# Patient Record
Sex: Female | Born: 1975 | Race: Asian | Hispanic: No | Marital: Married | Smoking: Never smoker
Health system: Southern US, Community
[De-identification: ages and names within clinical notes are randomized; demographics above are authoritative.]

## PROBLEM LIST (undated history)

## (undated) DIAGNOSIS — K921 Melena: Secondary | ICD-10-CM

## (undated) DIAGNOSIS — E079 Disorder of thyroid, unspecified: Secondary | ICD-10-CM

## (undated) HISTORY — DX: Disorder of thyroid, unspecified: E07.9

## (undated) HISTORY — PX: APPENDECTOMY: SHX54

## (undated) HISTORY — DX: Melena: K92.1

---

## 2013-01-09 ENCOUNTER — Other Ambulatory Visit (HOSPITAL_COMMUNITY)
Admission: RE | Admit: 2013-01-09 | Discharge: 2013-01-09 | Disposition: A | Discharge status: Home / Self Care | Payer: No Typology Code available for payment source | Source: Ambulatory Visit | Attending: Obstetrics and Gynecology | Admitting: Obstetrics and Gynecology

## 2013-01-09 DIAGNOSIS — Z1151 Encounter for screening for human papillomavirus (HPV): Secondary | ICD-10-CM | POA: Insufficient documentation

## 2013-01-09 DIAGNOSIS — Z01419 Encounter for gynecological examination (general) (routine) without abnormal findings: Secondary | ICD-10-CM | POA: Insufficient documentation

## 2013-02-22 ENCOUNTER — Other Ambulatory Visit: Payer: Self-pay

## 2013-02-22 ENCOUNTER — Other Ambulatory Visit: Payer: Self-pay | Admitting: Obstetrics and Gynecology

## 2013-02-22 DIAGNOSIS — Z1231 Encounter for screening mammogram for malignant neoplasm of breast: Secondary | ICD-10-CM

## 2013-02-22 DIAGNOSIS — N6320 Unspecified lump in the left breast, unspecified quadrant: Secondary | ICD-10-CM

## 2013-03-08 ENCOUNTER — Other Ambulatory Visit: Payer: No Typology Code available for payment source

## 2013-03-10 ENCOUNTER — Ambulatory Visit
Admission: RE | Admit: 2013-03-10 | Discharge: 2013-03-10 | Disposition: A | Discharge status: Home / Self Care | Payer: No Typology Code available for payment source | Source: Ambulatory Visit | Attending: Obstetrics and Gynecology | Admitting: Obstetrics and Gynecology

## 2013-03-10 DIAGNOSIS — N6320 Unspecified lump in the left breast, unspecified quadrant: Secondary | ICD-10-CM

## 2013-03-16 ENCOUNTER — Ambulatory Visit: Payer: No Typology Code available for payment source

## 2013-10-02 ENCOUNTER — Other Ambulatory Visit: Payer: Self-pay | Admitting: Obstetrics and Gynecology

## 2013-10-02 DIAGNOSIS — N6320 Unspecified lump in the left breast, unspecified quadrant: Secondary | ICD-10-CM

## 2013-10-09 ENCOUNTER — Ambulatory Visit
Admission: RE | Admit: 2013-10-09 | Discharge: 2013-10-09 | Disposition: A | Discharge status: Home / Self Care | Payer: No Typology Code available for payment source | Source: Ambulatory Visit | Attending: Obstetrics and Gynecology | Admitting: Obstetrics and Gynecology

## 2013-10-09 DIAGNOSIS — N6320 Unspecified lump in the left breast, unspecified quadrant: Secondary | ICD-10-CM

## 2014-05-02 ENCOUNTER — Other Ambulatory Visit: Payer: Self-pay | Admitting: Obstetrics and Gynecology

## 2014-05-02 DIAGNOSIS — D242 Benign neoplasm of left breast: Secondary | ICD-10-CM

## 2014-05-04 ENCOUNTER — Encounter (INDEPENDENT_AMBULATORY_CARE_PROVIDER_SITE_OTHER): Payer: Self-pay

## 2014-05-04 ENCOUNTER — Ambulatory Visit
Admission: RE | Admit: 2014-05-04 | Discharge: 2014-05-04 | Disposition: A | Discharge status: Home / Self Care | Payer: No Typology Code available for payment source | Source: Ambulatory Visit | Attending: Obstetrics and Gynecology | Admitting: Obstetrics and Gynecology

## 2014-05-04 DIAGNOSIS — D242 Benign neoplasm of left breast: Secondary | ICD-10-CM

## 2015-02-27 LAB — TSH: TSH: 1.56 (ref 0.41–5.90)

## 2015-03-27 ENCOUNTER — Other Ambulatory Visit: Payer: Self-pay | Admitting: Obstetrics and Gynecology

## 2015-03-27 DIAGNOSIS — N644 Mastodynia: Secondary | ICD-10-CM

## 2015-03-27 DIAGNOSIS — N6320 Unspecified lump in the left breast, unspecified quadrant: Secondary | ICD-10-CM

## 2015-04-03 ENCOUNTER — Ambulatory Visit
Admission: RE | Admit: 2015-04-03 | Discharge: 2015-04-03 | Disposition: A | Discharge status: Home / Self Care | Payer: No Typology Code available for payment source | Source: Ambulatory Visit | Attending: Obstetrics and Gynecology | Admitting: Obstetrics and Gynecology

## 2015-04-03 DIAGNOSIS — N6320 Unspecified lump in the left breast, unspecified quadrant: Secondary | ICD-10-CM

## 2015-04-03 DIAGNOSIS — N644 Mastodynia: Secondary | ICD-10-CM

## 2015-09-17 ENCOUNTER — Other Ambulatory Visit: Payer: Self-pay | Admitting: Obstetrics and Gynecology

## 2015-09-17 ENCOUNTER — Other Ambulatory Visit (HOSPITAL_COMMUNITY)
Admission: RE | Admit: 2015-09-17 | Discharge: 2015-09-17 | Disposition: A | Discharge status: Home / Self Care | Payer: No Typology Code available for payment source | Source: Ambulatory Visit | Attending: Obstetrics and Gynecology | Admitting: Obstetrics and Gynecology

## 2015-09-17 DIAGNOSIS — Z1151 Encounter for screening for human papillomavirus (HPV): Secondary | ICD-10-CM | POA: Diagnosis present

## 2015-09-17 DIAGNOSIS — Z01419 Encounter for gynecological examination (general) (routine) without abnormal findings: Secondary | ICD-10-CM | POA: Diagnosis present

## 2015-09-18 LAB — CYTOLOGY - PAP

## 2015-09-19 ENCOUNTER — Other Ambulatory Visit: Payer: Self-pay | Admitting: Obstetrics and Gynecology

## 2015-09-19 DIAGNOSIS — N6310 Unspecified lump in the right breast, unspecified quadrant: Secondary | ICD-10-CM

## 2015-09-19 DIAGNOSIS — N6320 Unspecified lump in the left breast, unspecified quadrant: Secondary | ICD-10-CM

## 2015-10-07 ENCOUNTER — Ambulatory Visit
Admission: RE | Admit: 2015-10-07 | Discharge: 2015-10-07 | Disposition: A | Discharge status: Home / Self Care | Payer: No Typology Code available for payment source | Source: Ambulatory Visit | Attending: Obstetrics and Gynecology | Admitting: Obstetrics and Gynecology

## 2015-10-07 DIAGNOSIS — N6310 Unspecified lump in the right breast, unspecified quadrant: Secondary | ICD-10-CM

## 2015-10-07 DIAGNOSIS — N6320 Unspecified lump in the left breast, unspecified quadrant: Secondary | ICD-10-CM

## 2016-05-05 ENCOUNTER — Other Ambulatory Visit: Payer: Self-pay | Admitting: Obstetrics and Gynecology

## 2016-05-05 DIAGNOSIS — N63 Unspecified lump in unspecified breast: Secondary | ICD-10-CM

## 2016-05-15 ENCOUNTER — Other Ambulatory Visit: Payer: No Typology Code available for payment source

## 2016-05-22 ENCOUNTER — Ambulatory Visit
Admission: RE | Admit: 2016-05-22 | Discharge: 2016-05-22 | Disposition: A | Discharge status: Home / Self Care | Payer: No Typology Code available for payment source | Source: Ambulatory Visit | Attending: Obstetrics and Gynecology | Admitting: Obstetrics and Gynecology

## 2016-05-22 DIAGNOSIS — N63 Unspecified lump in unspecified breast: Secondary | ICD-10-CM

## 2016-07-29 ENCOUNTER — Other Ambulatory Visit: Payer: Self-pay | Admitting: Internal Medicine

## 2016-07-29 ENCOUNTER — Ambulatory Visit
Admission: RE | Admit: 2016-07-29 | Discharge: 2016-07-29 | Disposition: A | Discharge status: Home / Self Care | Payer: No Typology Code available for payment source | Source: Ambulatory Visit | Attending: Internal Medicine | Admitting: Internal Medicine

## 2016-07-29 DIAGNOSIS — M79672 Pain in left foot: Secondary | ICD-10-CM

## 2016-10-22 LAB — BASIC METABOLIC PANEL
BUN: 9 (ref 4–21)
Creatinine: 0.6 (ref 0.5–1.1)
Glucose: 90
Potassium: 3.8 (ref 3.4–5.3)
SODIUM: 138 (ref 137–147)

## 2016-10-22 LAB — LIPID PANEL
CHOLESTEROL: 164 (ref 0–200)
LDL Cholesterol: 117
TRIGLYCERIDES: 46 (ref 40–160)

## 2016-10-22 LAB — VITAMIN D 25 HYDROXY (VIT D DEFICIENCY, FRACTURES): Vit D, 25-Hydroxy: 32.3

## 2016-10-22 LAB — CBC AND DIFFERENTIAL
HEMATOCRIT: 37 (ref 36–46)
HEMOGLOBIN: 13 (ref 12.0–16.0)
PLATELETS: 250 (ref 150–399)
WBC: 7.7

## 2016-10-22 LAB — HEPATIC FUNCTION PANEL
ALK PHOS: 44 (ref 25–125)
ALT: 25 (ref 7–35)
AST: 17 (ref 13–35)
Bilirubin, Total: 0.6

## 2016-10-22 LAB — TSH: TSH: 2.02 (ref 0.41–5.90)

## 2017-03-10 ENCOUNTER — Ambulatory Visit
Admission: RE | Admit: 2017-03-10 | Discharge: 2017-03-10 | Disposition: A | Discharge status: Home / Self Care | Payer: No Typology Code available for payment source | Source: Ambulatory Visit | Attending: Internal Medicine | Admitting: Internal Medicine

## 2017-03-10 ENCOUNTER — Other Ambulatory Visit: Payer: Self-pay | Admitting: Internal Medicine

## 2017-03-10 DIAGNOSIS — M25561 Pain in right knee: Secondary | ICD-10-CM

## 2017-04-13 ENCOUNTER — Other Ambulatory Visit: Payer: Self-pay | Admitting: Orthopedic Surgery

## 2017-04-13 DIAGNOSIS — M25561 Pain in right knee: Secondary | ICD-10-CM

## 2017-04-24 ENCOUNTER — Ambulatory Visit
Admission: RE | Admit: 2017-04-24 | Discharge: 2017-04-24 | Disposition: A | Discharge status: Home / Self Care | Payer: No Typology Code available for payment source | Source: Ambulatory Visit | Attending: Orthopedic Surgery | Admitting: Orthopedic Surgery

## 2017-04-24 DIAGNOSIS — M25561 Pain in right knee: Secondary | ICD-10-CM

## 2017-08-26 LAB — TSH: TSH: 0.98 (ref 0.41–5.90)

## 2017-10-07 ENCOUNTER — Other Ambulatory Visit: Payer: Self-pay | Admitting: Obstetrics and Gynecology

## 2017-10-07 DIAGNOSIS — N63 Unspecified lump in unspecified breast: Secondary | ICD-10-CM

## 2017-10-11 ENCOUNTER — Other Ambulatory Visit: Payer: No Typology Code available for payment source

## 2017-10-13 ENCOUNTER — Ambulatory Visit
Admission: RE | Admit: 2017-10-13 | Discharge: 2017-10-13 | Disposition: A | Discharge status: Home / Self Care | Payer: 59 | Source: Ambulatory Visit | Attending: Obstetrics and Gynecology | Admitting: Obstetrics and Gynecology

## 2017-10-13 ENCOUNTER — Ambulatory Visit
Admission: RE | Admit: 2017-10-13 | Discharge: 2017-10-13 | Disposition: A | Discharge status: Home / Self Care | Payer: Self-pay | Source: Ambulatory Visit | Attending: Obstetrics and Gynecology | Admitting: Obstetrics and Gynecology

## 2017-10-13 DIAGNOSIS — N63 Unspecified lump in unspecified breast: Secondary | ICD-10-CM

## 2017-10-13 LAB — HM MAMMOGRAPHY

## 2017-12-02 ENCOUNTER — Ambulatory Visit (INDEPENDENT_AMBULATORY_CARE_PROVIDER_SITE_OTHER): Payer: 59 | Admitting: Family Medicine

## 2017-12-02 ENCOUNTER — Encounter: Payer: Self-pay | Admitting: Family Medicine

## 2017-12-02 VITALS — BP 116/62 | HR 80 | Temp 98.0°F | Ht 64.0 in | Wt 142.2 lb

## 2017-12-02 DIAGNOSIS — Z13220 Encounter for screening for lipoid disorders: Secondary | ICD-10-CM

## 2017-12-02 DIAGNOSIS — M25561 Pain in right knee: Secondary | ICD-10-CM

## 2017-12-02 DIAGNOSIS — Z0001 Encounter for general adult medical examination with abnormal findings: Secondary | ICD-10-CM | POA: Diagnosis not present

## 2017-12-02 DIAGNOSIS — G8929 Other chronic pain: Secondary | ICD-10-CM

## 2017-12-02 DIAGNOSIS — E039 Hypothyroidism, unspecified: Secondary | ICD-10-CM | POA: Insufficient documentation

## 2017-12-02 DIAGNOSIS — K649 Unspecified hemorrhoids: Secondary | ICD-10-CM | POA: Insufficient documentation

## 2017-12-02 MED ORDER — HYDROCORTISONE ACETATE 25 MG RE SUPP: 25.0000 mg | Freq: Two times a day (BID) | RECTAL | 0 refills | Status: AC

## 2017-12-02 NOTE — Assessment & Plan Note (Signed)
Stable.  Continue Synthroid 75 mcg daily.  Check TSH today.

## 2017-12-02 NOTE — Patient Instructions (Signed)
It was very nice to see you today!  Please use the suppository for your hemorrhoid. Let me know if your symptoms worsen or do not improve over the next few days.  Come back to see me in 1 year for your next physical.  I would like to get your records form your OBGYN regarding your most recent lab work.  Come back to see me sooner as needed.  Take care, Dr Jerline Pain   Preventive Care 40-64 Years, Female Preventive care refers to lifestyle choices and visits with your health care provider that can promote health and wellness. What does preventive care include?  A yearly physical exam. This is also called an annual well check.  Dental exams once or twice a year.  Routine eye exams. Ask your health care provider how often you should have your eyes checked.  Personal lifestyle choices, including: ? Daily care of your teeth and gums. ? Regular physical activity. ? Eating a healthy diet. ? Avoiding tobacco and drug use. ? Limiting alcohol use. ? Practicing safe sex. ? Taking low-dose aspirin daily starting at age 46. ? Taking vitamin and mineral supplements as recommended by your health care provider. What happens during an annual well check? The services and screenings done by your health care provider during your annual well check will depend on your age, overall health, lifestyle risk factors, and family history of disease. Counseling Your health care provider may ask you questions about your:  Alcohol use.  Tobacco use.  Drug use.  Emotional well-being.  Home and relationship well-being.  Sexual activity.  Eating habits.  Work and work Statistician.  Method of birth control.  Menstrual cycle.  Pregnancy history.  Screening You may have the following tests or measurements:  Height, weight, and BMI.  Blood pressure.  Lipid and cholesterol levels. These may be checked every 5 years, or more frequently if you are over 22 years old.  Skin check.  Lung cancer  screening. You may have this screening every year starting at age 107 if you have a 30-pack-year history of smoking and currently smoke or have quit within the past 15 years.  Fecal occult blood test (FOBT) of the stool. You may have this test every year starting at age 31.  Flexible sigmoidoscopy or colonoscopy. You may have a sigmoidoscopy every 5 years or a colonoscopy every 10 years starting at age 20.  Hepatitis C blood test.  Hepatitis B blood test.  Sexually transmitted disease (STD) testing.  Diabetes screening. This is done by checking your blood sugar (glucose) after you have not eaten for a while (fasting). You may have this done every 1-3 years.  Mammogram. This may be done every 1-2 years. Talk to your health care provider about when you should start having regular mammograms. This may depend on whether you have a family history of breast cancer.  BRCA-related cancer screening. This may be done if you have a family history of breast, ovarian, tubal, or peritoneal cancers.  Pelvic exam and Pap test. This may be done every 3 years starting at age 54. Starting at age 77, this may be done every 5 years if you have a Pap test in combination with an HPV test.  Bone density scan. This is done to screen for osteoporosis. You may have this scan if you are at high risk for osteoporosis.  Discuss your test results, treatment options, and if necessary, the need for more tests with your health care provider. Vaccines Your health  care provider may recommend certain vaccines, such as:  Influenza vaccine. This is recommended every year.  Tetanus, diphtheria, and acellular pertussis (Tdap, Td) vaccine. You may need a Td booster every 10 years.  Varicella vaccine. You may need this if you have not been vaccinated.  Zoster vaccine. You may need this after age 44.  Measles, mumps, and rubella (MMR) vaccine. You may need at least one dose of MMR if you were born in 1957 or later. You may  also need a second dose.  Pneumococcal 13-valent conjugate (PCV13) vaccine. You may need this if you have certain conditions and were not previously vaccinated.  Pneumococcal polysaccharide (PPSV23) vaccine. You may need one or two doses if you smoke cigarettes or if you have certain conditions.  Meningococcal vaccine. You may need this if you have certain conditions.  Hepatitis A vaccine. You may need this if you have certain conditions or if you travel or work in places where you may be exposed to hepatitis A.  Hepatitis B vaccine. You may need this if you have certain conditions or if you travel or work in places where you may be exposed to hepatitis B.  Haemophilus influenzae type b (Hib) vaccine. You may need this if you have certain conditions.  Talk to your health care provider about which screenings and vaccines you need and how often you need them. This information is not intended to replace advice given to you by your health care provider. Make sure you discuss any questions you have with your health care provider. Document Released: 06/28/2015 Document Revised: 02/19/2016 Document Reviewed: 04/02/2015 Elsevier Interactive Patient Education  [employer].

## 2017-12-02 NOTE — Progress Notes (Signed)
Subjective:  Tammy Bates is a 42 y.o. female who presents today for her annual comprehensive physical exam and to establish care.  HPI:  She has 1 acute complaint today.   1.  Rectal bleeding.  Patient with intermittent rectal bleeding for the past year.  Had a recent worsening of her symptoms within the past few days.  She has been prescribed a suppository in the past, but does not remember the name of this.  Blood is bright red in characteristic.  Symptoms are worse after eating spicy foods.  Symptoms improved with fiber intake.  Lifestyle Diet: No specific diets. Exercise: No specific exercises.   Depression screen PHQ 2/9 12/02/2017  Decreased Interest 0  Down, Depressed, Hopeless 0  PHQ - 2 Score 0   Health Maintenance Due  Topic Date Due  . HIV Screening  06/17/1992  . TETANUS/TDAP  06/17/1996    ROS: Rectal bleeding, otherwise a complete review of systems was negative.   PMH:  The following were reviewed and entered/updated in epic: Past Medical History:  Diagnosis Date  . Thyroid disease    Patient Active Problem List   Diagnosis Date Noted  . Hemorrhoids 12/02/2017  . Hypothyroidism 12/02/2017  . Chronic pain of right knee 12/02/2017   Past Surgical History:  Procedure Laterality Date  . APPENDECTOMY      Family History  Problem Relation Age of Onset  . Hypertension Mother   . Diabetes Mother   . Cancer Neg Hx     Medications- reviewed and updated Current Outpatient Medications  Medication Sig Dispense Refill  . levothyroxine (SYNTHROID, LEVOTHROID) 75 MCG tablet Take 75 mcg by mouth daily.  2  . hydrocortisone (ANUSOL-HC) 25 MG suppository Place 1 suppository (25 mg total) rectally 2 (two) times daily. 12 suppository 0   No current facility-administered medications for this visit.     Allergies-reviewed and updated No Known Allergies  Social History   Socioeconomic History  . Marital status: Married    Spouse name: Not on file    . Number of children: Not on file  . Years of education: Not on file  . Highest education level: Not on file  Occupational History  . Not on file  Social Needs  . Financial resource strain: Not on file  . Food insecurity:    Worry: Not on file    Inability: Not on file  . Transportation needs:    Medical: Not on file    Non-medical: Not on file  Tobacco Use  . Smoking status: Never Smoker  . Smokeless tobacco: Never Used  Substance and Sexual Activity  . Alcohol use: Not Currently  . Drug use: Never  . Sexual activity: Yes  Lifestyle  . Physical activity:    Days per week: Not on file    Minutes per session: Not on file  . Stress: Not on file  Relationships  . Social connections:    Talks on phone: Not on file    Gets together: Not on file    Attends religious service: Not on file    Active member of club or organization: Not on file    Attends meetings of clubs or organizations: Not on file    Relationship status: Not on file  Other Topics Concern  . Not on file  Social History Narrative  . Not on file    Objective:  Physical Exam: BP 116/62 (BP Location: Left Arm, Patient Position: Sitting, Cuff Size: Normal)   Pulse 80  Temp 98 F (36.7 C) (Oral)   Ht 5\' 4"  (1.626 m)   Wt 142 lb 3.2 oz (64.5 kg)   SpO2 97%   BMI 24.41 kg/m   Body mass index is 24.41 kg/m. Wt Readings from Last 3 Encounters:  12/02/17 142 lb 3.2 oz (64.5 kg)   Gen: NAD, resting comfortably HEENT: TMs normal bilaterally. OP clear. No thyromegaly noted.  CV: RRR with no murmurs appreciated Pulm: NWOB, CTAB with no crackles, wheezes, or rhonchi GI: Normal bowel sounds present. Soft, Nontender, Nondistended. GU: Small right posterior hemorrhoid noted.  Scant amount of red blood noted. MSK: no edema, cyanosis, or clubbing noted Skin: warm, dry Neuro: CN2-12 grossly intact. Strength 5/5 in upper and lower extremities. Reflexes symmetric and intact bilaterally.  Psych: Normal affect and  thought content  Assessment/Plan:  Hemorrhoids No red flag signs or symptoms.  Check CBC today.  Start hydrocortisone suppository.  Discussed reasons to return to care.  If no improvement, would consider referral to GI.  Hypothyroidism Stable.  Continue Synthroid 75 mcg daily.  Check TSH today.  Preventative Healthcare: Check lipid panel.  Up-to-date on Pap smear and mammogram.  Will obtain records from her OB/GYN for these.  Patient Counseling(The following topics were reviewed and/or handout was given):  -Nutrition: Stressed importance of moderation in sodium/caffeine intake, saturated fat and cholesterol, caloric balance, sufficient intake of fresh fruits, vegetables, and fiber.  -Stressed the importance of regular exercise.   -Substance Abuse: Discussed cessation/primary prevention of tobacco, alcohol, or other drug use; driving or other dangerous activities under the influence; availability of treatment for abuse.   -Injury prevention: Discussed safety belts, safety helmets, smoke detector, smoking near bedding or upholstery.   -Sexuality: Discussed sexually transmitted diseases, partner selection, use of condoms, avoidance of unintended pregnancy and contraceptive alternatives.   -Dental health: Discussed importance of regular tooth brushing, flossing, and dental visits.  -Health maintenance and immunizations reviewed. Please refer to Health maintenance section.  Return to care in 1 year for next preventative visit.   Algis Greenhouse. Jerline Pain, MD 12/02/2017 4:05 PM

## 2017-12-02 NOTE — Assessment & Plan Note (Signed)
No red flag signs or symptoms.  Check CBC today.  Start hydrocortisone suppository.  Discussed reasons to return to care.  If no improvement, would consider referral to GI.

## 2017-12-03 LAB — TSH: TSH: 1.21 u[IU]/mL (ref 0.35–4.50)

## 2017-12-03 LAB — COMPREHENSIVE METABOLIC PANEL
ALT: 40 U/L — ABNORMAL HIGH (ref 0–35)
AST: 29 U/L (ref 0–37)
Albumin: 3.9 g/dL (ref 3.5–5.2)
Alkaline Phosphatase: 49 U/L (ref 39–117)
BILIRUBIN TOTAL: 0.5 mg/dL (ref 0.2–1.2)
BUN: 13 mg/dL (ref 6–23)
CHLORIDE: 106 meq/L (ref 96–112)
CO2: 27 meq/L (ref 19–32)
Calcium: 8.9 mg/dL (ref 8.4–10.5)
Creatinine, Ser: 0.81 mg/dL (ref 0.40–1.20)
GFR: 83.04 mL/min (ref >60.00)
Glucose, Bld: 98 mg/dL (ref 70–99)
Potassium: 3.8 mEq/L (ref 3.5–5.1)
Sodium: 139 mEq/L (ref 135–145)
Total Protein: 6.9 g/dL (ref 6.0–8.3)

## 2017-12-03 LAB — LIPID PANEL
CHOLESTEROL: 143 mg/dL (ref 0–200)
HDL: 35.6 mg/dL — ABNORMAL LOW (ref >39.00)
LDL CALC: 88 mg/dL (ref 0–99)
NonHDL: 106.95
TRIGLYCERIDES: 97 mg/dL (ref 0.0–149.0)
Total CHOL/HDL Ratio: 4
VLDL: 19.4 mg/dL (ref 0.0–40.0)

## 2017-12-03 LAB — CBC
HCT: 37.3 % (ref 36.0–46.0)
Hemoglobin: 12.9 g/dL (ref 12.0–15.0)
MCHC: 34.4 g/dL (ref 30.0–36.0)
MCV: 89.2 fl (ref 78.0–100.0)
PLATELETS: 278 10*3/uL (ref 150.0–400.0)
RBC: 4.18 Mil/uL (ref 3.87–5.11)
RDW: 12.1 % (ref 11.5–15.5)
WBC: 9.4 10*3/uL (ref 4.0–10.5)

## 2018-07-04 ENCOUNTER — Ambulatory Visit (INDEPENDENT_AMBULATORY_CARE_PROVIDER_SITE_OTHER): Payer: 59 | Admitting: Physician Assistant

## 2018-07-04 ENCOUNTER — Encounter: Payer: Self-pay | Admitting: Physician Assistant

## 2018-07-04 VITALS — BP 108/70 | HR 84 | Temp 98.6°F | Ht 64.0 in | Wt 144.5 lb

## 2018-07-04 DIAGNOSIS — J0100 Acute maxillary sinusitis, unspecified: Secondary | ICD-10-CM | POA: Diagnosis not present

## 2018-07-04 LAB — POC INFLUENZA A&B (BINAX/QUICKVUE)
INFLUENZA A, POC: NEGATIVE
INFLUENZA B, POC: NEGATIVE

## 2018-07-04 MED ORDER — AMOXICILLIN-POT CLAVULANATE 875-125 MG PO TABS: 1.0000 | ORAL_TABLET | Freq: Two times a day (BID) | ORAL | 0 refills | Status: DC

## 2018-07-04 NOTE — Progress Notes (Signed)
Tammy Bates is a 43 y.o. female here for a new problem.  I acted as a Education administrator for [employer], PA-C Anselmo Pickler, LPN  History of Present Illness:   Chief Complaint  Patient presents with  . Cough    Cough  This is a new problem. Episode onset: Started 2 weeks ago. The problem has been gradually worsening. The problem occurs every few hours (evening worse). The cough is productive of sputum (expectorating yellow/green sputum.). Associated symptoms include headaches, nasal congestion, postnasal drip and a sore throat. Pertinent negatives include no chills, ear congestion, ear pain, fever or shortness of breath. Associated symptoms comments: Body aches. The symptoms are aggravated by lying down. Treatments tried: Nyquil. The treatment provided no relief. There is no history of asthma, bronchitis or pneumonia.    Past Medical History:  Diagnosis Date  . Blood in stool   . Thyroid disease      Social History   Socioeconomic History  . Marital status: Married    Spouse name: Not on file  . Number of children: Not on file  . Years of education: Not on file  . Highest education level: Not on file  Occupational History  . Not on file  Social Needs  . Financial resource strain: Not on file  . Food insecurity:    Worry: Not on file    Inability: Not on file  . Transportation needs:    Medical: Not on file    Non-medical: Not on file  Tobacco Use  . Smoking status: Never Smoker  . Smokeless tobacco: Never Used  Substance and Sexual Activity  . Alcohol use: Not Currently  . Drug use: Never  . Sexual activity: Yes  Lifestyle  . Physical activity:    Days per week: Not on file    Minutes per session: Not on file  . Stress: Not on file  Relationships  . Social connections:    Talks on phone: Not on file    Gets together: Not on file    Attends religious service: Not on file    Active member of club or organization: Not on file    Attends meetings of clubs or  organizations: Not on file    Relationship status: Not on file  . Intimate partner violence:    Fear of current or ex partner: Not on file    Emotionally abused: Not on file    Physically abused: Not on file    Forced sexual activity: Not on file  Other Topics Concern  . Not on file  Social History Narrative  . Not on file    Past Surgical History:  Procedure Laterality Date  . APPENDECTOMY      Family History  Problem Relation Age of Onset  . Hypertension Mother   . Diabetes Mother   . Diabetes Father   . Hypertension Father   . Cancer Neg Hx     No Known Allergies  Current Medications:   Current Outpatient Medications:  .  hydrocortisone (ANUSOL-HC) 25 MG suppository, Place 1 suppository (25 mg total) rectally 2 (two) times daily., Disp: 12 suppository, Rfl: 0 .  levothyroxine (SYNTHROID, LEVOTHROID) 75 MCG tablet, Take 75 mcg by mouth daily., Disp: , Rfl: 2 .  amoxicillin-clavulanate (AUGMENTIN) 875-125 MG tablet, Take 1 tablet by mouth 2 (two) times daily., Disp: 20 tablet, Rfl: 0   Review of Systems:   Review of Systems  Constitutional: Negative for chills and fever.  HENT: Positive for postnasal drip  and sore throat. Negative for ear pain.   Respiratory: Positive for cough. Negative for shortness of breath.   Neurological: Positive for headaches.    Vitals:   Vitals:   07/04/18 1244  BP: 108/70  Pulse: 84  Temp: 98.6 F (37 C)  TempSrc: Oral  SpO2: 99%  Weight: 144 lb 8 oz (65.5 kg)  Height: 5\' 4"  (1.626 m)     Body mass index is 24.8 kg/m.  Physical Exam:   Physical Exam Vitals signs and nursing note reviewed.  Constitutional:      General: She is not in acute distress.    Appearance: She is well-developed. She is not ill-appearing or toxic-appearing.  HENT:     Head: Normocephalic and atraumatic.     Right Ear: Tympanic membrane, ear canal and external ear normal. Tympanic membrane is not erythematous, retracted or bulging.     Left Ear:  Tympanic membrane, ear canal and external ear normal. Tympanic membrane is not erythematous, retracted or bulging.     Nose: Congestion and rhinorrhea present.     Right Sinus: No maxillary sinus tenderness or frontal sinus tenderness.     Left Sinus: No maxillary sinus tenderness or frontal sinus tenderness.     Mouth/Throat:     Lips: Pink.     Mouth: Mucous membranes are moist.     Pharynx: Uvula midline. Posterior oropharyngeal erythema present.     Tonsils: Swelling: 0 on the right. 0 on the left.  Eyes:     General: Lids are normal.     Conjunctiva/sclera: Conjunctivae normal.  Neck:     Trachea: Trachea normal.  Cardiovascular:     Rate and Rhythm: Normal rate and regular rhythm.     Heart sounds: Normal heart sounds, S1 normal and S2 normal.  Pulmonary:     Effort: Pulmonary effort is normal.     Breath sounds: Normal breath sounds. No decreased breath sounds, wheezing, rhonchi or rales.  Lymphadenopathy:     Cervical: No cervical adenopathy.  Skin:    General: Skin is warm and dry.  Neurological:     Mental Status: She is alert.  Psychiatric:        Speech: Speech normal.        Behavior: Behavior normal. Behavior is cooperative.     Results for orders placed or performed in visit on 07/04/18  POC Influenza A&B(BINAX/QUICKVUE)  Result Value Ref Range   Influenza A, POC Negative Negative   Influenza B, POC Negative Negative    Assessment and Plan:   Damica was seen today for cough.  Diagnoses and all orders for this visit:  Acute maxillary sinusitis, recurrence not specified No red flags on exam.  Flu test negative. Will initiate Augmentin per orders. Discussed taking medications as prescribed. Reviewed return precautions including worsening fever, SOB, worsening cough or other concerns. Push fluids and rest. I recommend that patient follow-up if symptoms worsen or persist despite treatment x 7-10 days, sooner if needed. -     POC Influenza  A&B(BINAX/QUICKVUE)  Other orders -     amoxicillin-clavulanate (AUGMENTIN) 875-125 MG tablet; Take 1 tablet by mouth 2 (two) times daily.   . Reviewed expectations re: course of current medical issues. . Discussed self-management of symptoms. . Outlined signs and symptoms indicating need for more acute intervention. . Patient verbalized understanding and all questions were answered. . See orders for this visit as documented in the electronic medical record. . Patient received an After-Visit Summary.  CMA  or LPN served as Education administrator during this visit. History, Physical, and Plan performed by medical provider. The above documentation has been reviewed and is accurate and complete.   Inda Coke, PA-C

## 2018-07-04 NOTE — Patient Instructions (Signed)
It was great to see you!  Use medication as prescribed: oral Augmentin antibiotic  Push fluids and get plenty of rest. Please return if you are not improving as expected, or if you have high fevers (>101.5) or difficulty swallowing or worsening productive cough.  Call clinic with questions.  I hope you start feeling better soon!

## 2018-08-11 ENCOUNTER — Encounter: Payer: Self-pay | Admitting: Family Medicine

## 2018-08-11 ENCOUNTER — Ambulatory Visit (INDEPENDENT_AMBULATORY_CARE_PROVIDER_SITE_OTHER): Payer: 59 | Admitting: Family Medicine

## 2018-08-11 VITALS — BP 110/64 | HR 76 | Temp 98.3°F | Ht 64.0 in | Wt 140.6 lb

## 2018-08-11 DIAGNOSIS — Z23 Encounter for immunization: Secondary | ICD-10-CM | POA: Diagnosis not present

## 2018-08-11 DIAGNOSIS — J329 Chronic sinusitis, unspecified: Secondary | ICD-10-CM

## 2018-08-11 MED ORDER — IPRATROPIUM BROMIDE 0.06 % NA SOLN: 2.0000 | Freq: Four times a day (QID) | NASAL | 0 refills | Status: AC

## 2018-08-11 MED ORDER — AZITHROMYCIN 250 MG PO TABS: ORAL_TABLET | ORAL | 0 refills | Status: AC

## 2018-08-11 NOTE — Progress Notes (Signed)
   Chief Complaint:  Tammy Bates is a 43 y.o. female who presents for same day appointment with a chief complaint of sore throat.   Assessment/Plan:  Sinusitis Likely secondary to viral URI. No signs of bacterial infection. Start atrovent for rhinorrhea/sinus congestion. Sent in a "pocket prescription" for azithromycin with strict instruction to not start unless symptoms worsen or fail to improve within the next several days. Recommended tylenol and/or motrin as needed for low grade fever and pain. Encouraged good oral hydration. Return precautions reviewed. Follow up as needed.   Preventative Healthcare Flu shot given today.      Subjective:  HPI:  Sore Throat, acute problem Started 2 weeks ago. Symptoms are stable. Associated with cough and voice changes. Husband has been sick with similar symptoms. She has not tried any treatments. Cough has been productive of green phlegm. No fevers or chills. No other obvious alleviating or aggravating factors.   ROS: Per HPI  PMH: She reports that she has never smoked. She has never used smokeless tobacco. She reports previous alcohol use. She reports that she does not use drugs.      Objective:  Physical Exam: BP 110/64 (BP Location: Left Arm, Patient Position: Sitting, Cuff Size: Normal)   Pulse 76   Temp 98.3 F (36.8 C) (Oral)   Ht 5\' 4"  (1.626 m)   Wt 140 lb 9.6 oz (63.8 kg)   SpO2 97%   BMI 24.13 kg/m   Gen: NAD, resting comfortably HEENT: TMs with clear effusion.  OP erythematous with no exudate.  Nasal mucosa erythematous and boggy with clear discharge. CV: Regular rate and rhythm with no murmurs appreciated Pulm: Normal work of breathing, clear to auscultation bilaterally with no crackles, wheezes, or rhonchi     Caleb M. Jerline Pain, MD 08/11/2018 9:31 AM

## 2018-08-11 NOTE — Patient Instructions (Signed)
Start the atrovent.  Start the zpack if your symptoms worsen or do not improve in a few days.  Please stay well hydrated.  You can take tylenol and/or motrin as needed for low grade fever and pain.  Please let me know if your symptoms worsen or fail to improve.  Take care, Dr Jerline Pain

## 2018-12-05 ENCOUNTER — Encounter: Payer: 59 | Admitting: Family Medicine

## 2018-12-08 ENCOUNTER — Encounter: Payer: 59 | Admitting: Family Medicine

## 2018-12-29 ENCOUNTER — Other Ambulatory Visit: Payer: Self-pay | Admitting: Obstetrics and Gynecology

## 2018-12-29 DIAGNOSIS — Z1231 Encounter for screening mammogram for malignant neoplasm of breast: Secondary | ICD-10-CM

## 2019-02-17 ENCOUNTER — Other Ambulatory Visit: Payer: Self-pay

## 2019-02-17 ENCOUNTER — Ambulatory Visit
Admission: RE | Admit: 2019-02-17 | Discharge: 2019-02-17 | Disposition: A | Discharge status: Home / Self Care | Payer: 59 | Source: Ambulatory Visit | Attending: Obstetrics and Gynecology | Admitting: Obstetrics and Gynecology

## 2019-02-17 DIAGNOSIS — Z1231 Encounter for screening mammogram for malignant neoplasm of breast: Secondary | ICD-10-CM

## 2020-03-29 ENCOUNTER — Other Ambulatory Visit: Payer: Self-pay | Admitting: Obstetrics and Gynecology

## 2020-03-29 DIAGNOSIS — Z1231 Encounter for screening mammogram for malignant neoplasm of breast: Secondary | ICD-10-CM

## 2020-04-26 ENCOUNTER — Other Ambulatory Visit: Payer: Self-pay

## 2020-04-26 ENCOUNTER — Ambulatory Visit
Admission: RE | Admit: 2020-04-26 | Discharge: 2020-04-26 | Disposition: A | Discharge status: Home / Self Care | Payer: 59 | Source: Ambulatory Visit | Attending: Obstetrics and Gynecology | Admitting: Obstetrics and Gynecology

## 2020-04-26 DIAGNOSIS — Z1231 Encounter for screening mammogram for malignant neoplasm of breast: Secondary | ICD-10-CM

## 2020-06-05 ENCOUNTER — Ambulatory Visit: Payer: 59

## 2020-07-08 ENCOUNTER — Ambulatory Visit: Payer: 59

## 2020-07-19 ENCOUNTER — Ambulatory Visit
Admission: RE | Admit: 2020-07-19 | Discharge: 2020-07-19 | Disposition: A | Discharge status: Home / Self Care | Payer: 59 | Source: Ambulatory Visit | Attending: Obstetrics and Gynecology | Admitting: Obstetrics and Gynecology

## 2020-07-19 ENCOUNTER — Other Ambulatory Visit: Payer: Self-pay

## 2021-04-24 ENCOUNTER — Other Ambulatory Visit: Payer: Self-pay | Admitting: Obstetrics and Gynecology

## 2021-04-24 DIAGNOSIS — N644 Mastodynia: Secondary | ICD-10-CM

## 2021-06-11 ENCOUNTER — Other Ambulatory Visit: Payer: 59

## 2021-07-03 ENCOUNTER — Ambulatory Visit: Payer: 59

## 2021-07-03 ENCOUNTER — Inpatient Hospital Stay: Admission: RE | Admit: 2021-07-03 | Payer: 59 | Source: Ambulatory Visit

## 2021-07-31 ENCOUNTER — Ambulatory Visit: Payer: 59

## 2021-07-31 ENCOUNTER — Ambulatory Visit
Admission: RE | Admit: 2021-07-31 | Discharge: 2021-07-31 | Disposition: A | Discharge status: Home / Self Care | Payer: 59 | Source: Ambulatory Visit | Attending: Obstetrics and Gynecology | Admitting: Obstetrics and Gynecology

## 2021-07-31 DIAGNOSIS — N644 Mastodynia: Secondary | ICD-10-CM

## 2021-08-06 ENCOUNTER — Other Ambulatory Visit: Payer: 59

## 2022-03-09 ENCOUNTER — Encounter: Payer: Self-pay | Admitting: *Deleted

## 2022-05-28 ENCOUNTER — Encounter: Payer: Self-pay | Admitting: *Deleted

## 2022-09-25 IMAGING — MG DIGITAL DIAGNOSTIC BILAT W/ TOMO W/ CAD
8 of 14 series · 9 of 40 positions shown · non-contrast
Comparison: Previous exam(s).

CLINICAL DATA: Patient presents for diffuse bilateral breast pain.

EXAM:
DIGITAL DIAGNOSTIC BILATERAL MAMMOGRAM WITH TOMOSYNTHESIS AND CAD
TECHNIQUE: Bilateral digital diagnostic mammography and breast tomosynthesis
was performed. The images were evaluated with computer-aided
detection.

[R CC synth-2D]
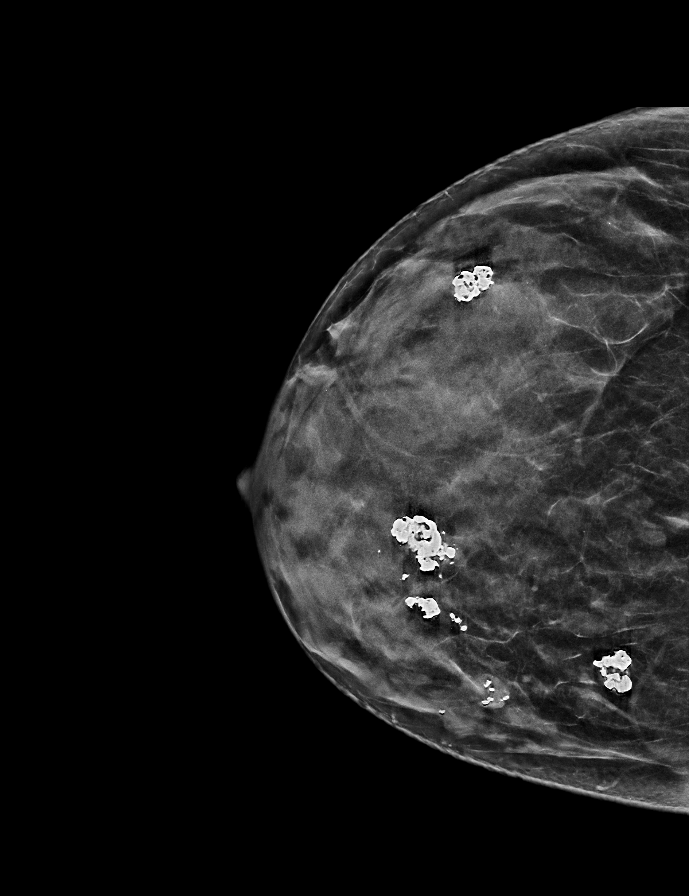

[L ML synth-2D]
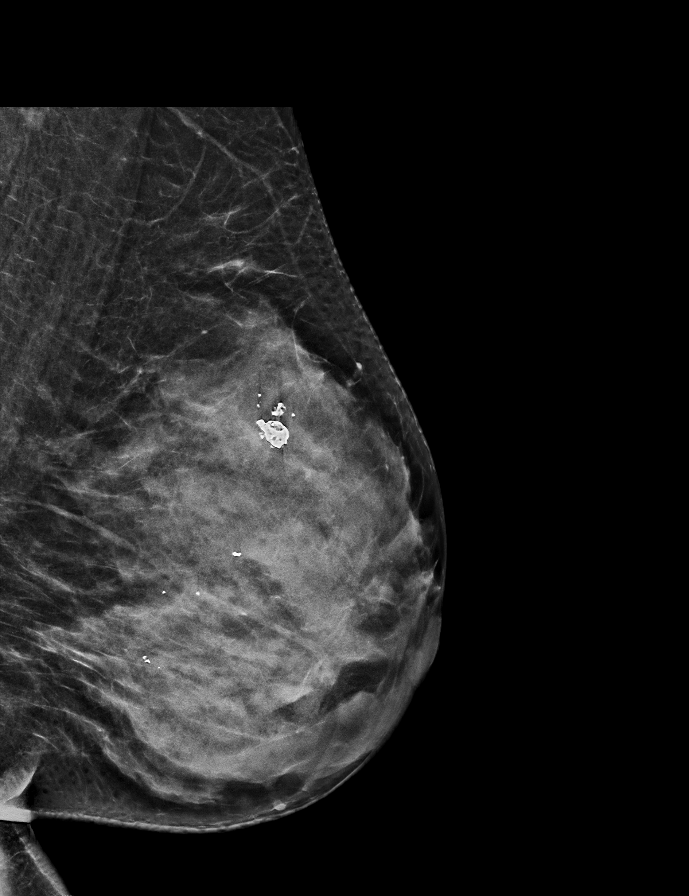

[L MLO synth-2D (1 of 3)]
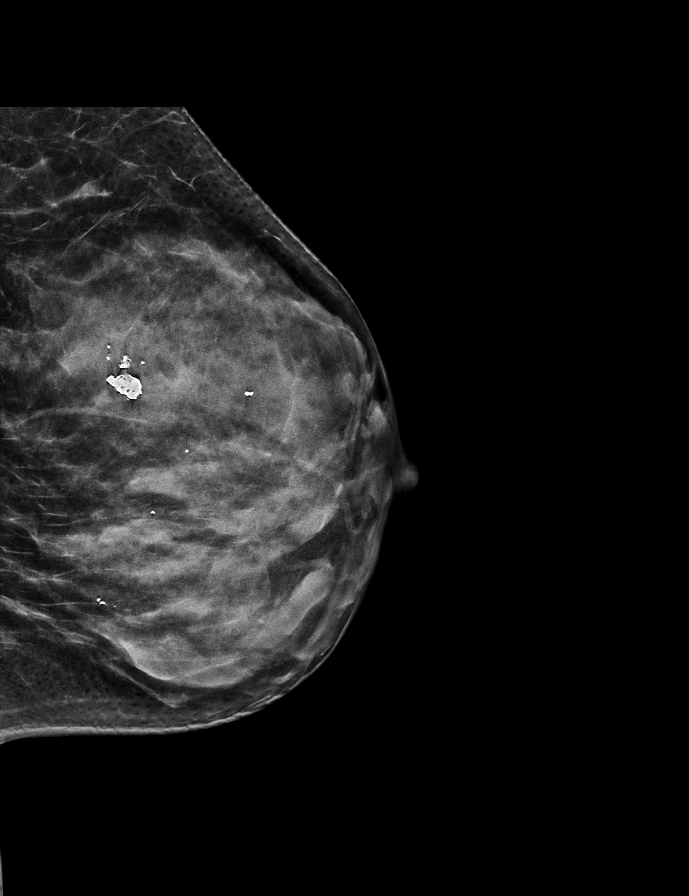

[L MLO synth-2D (2 of 3)]
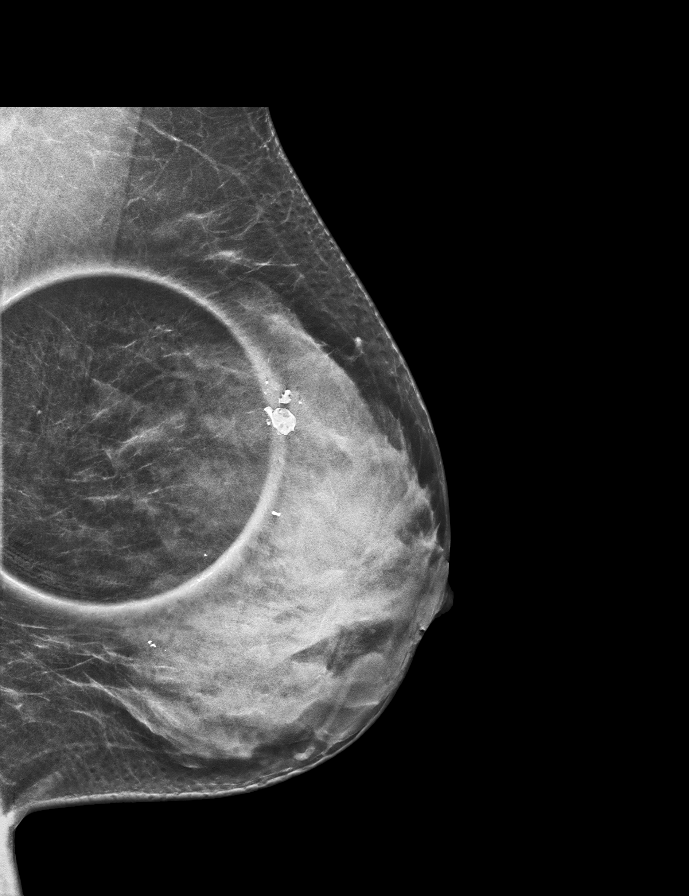

[L MLO synth-2D (3 of 3)]
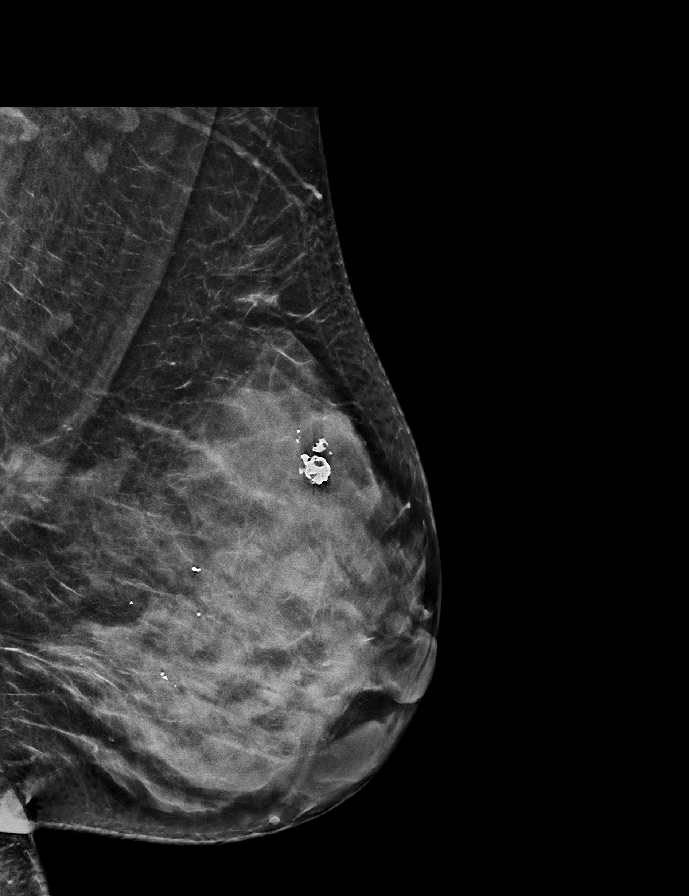

[R MLO synth-2D]
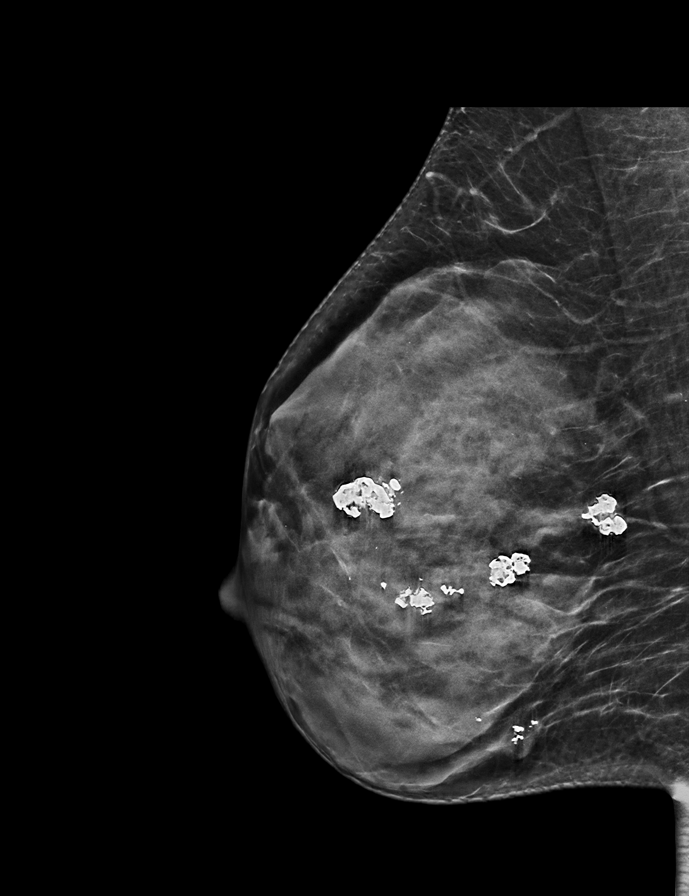

[L CC synth-2D]
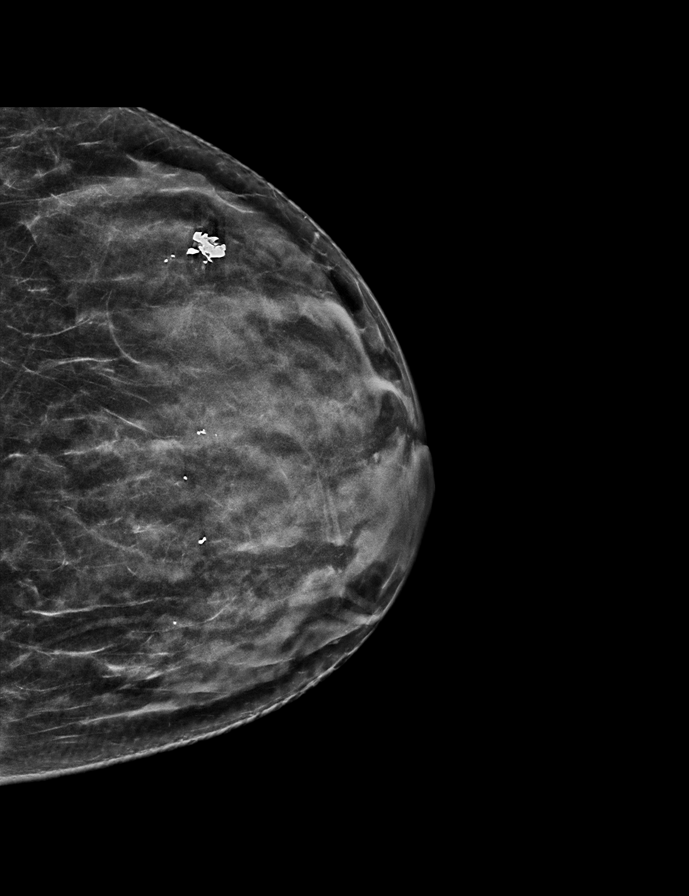

[L MLO tomo · 2 of 57 frames shown]
[frame 19/57]
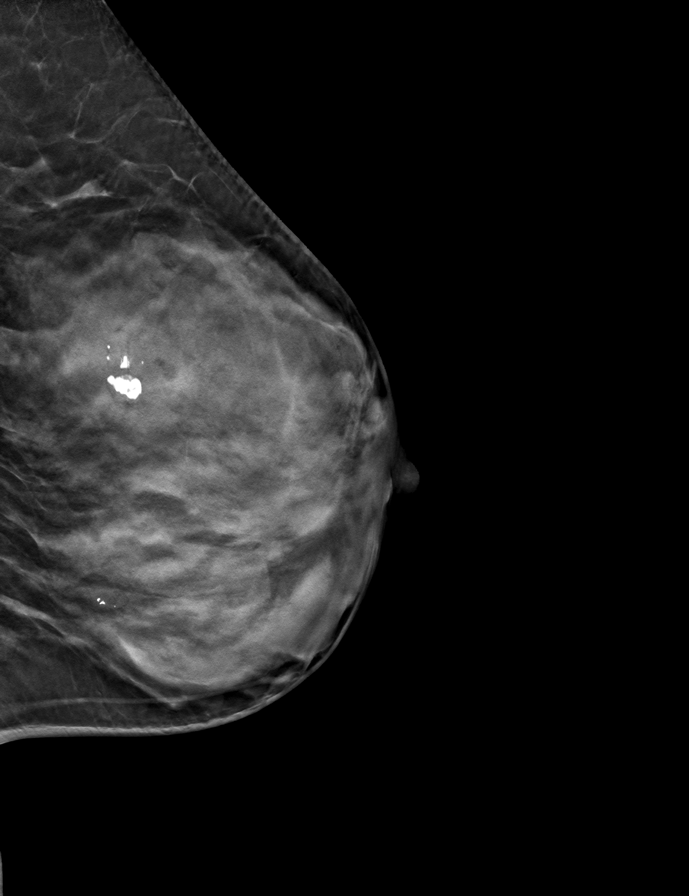
[frame 29/57]
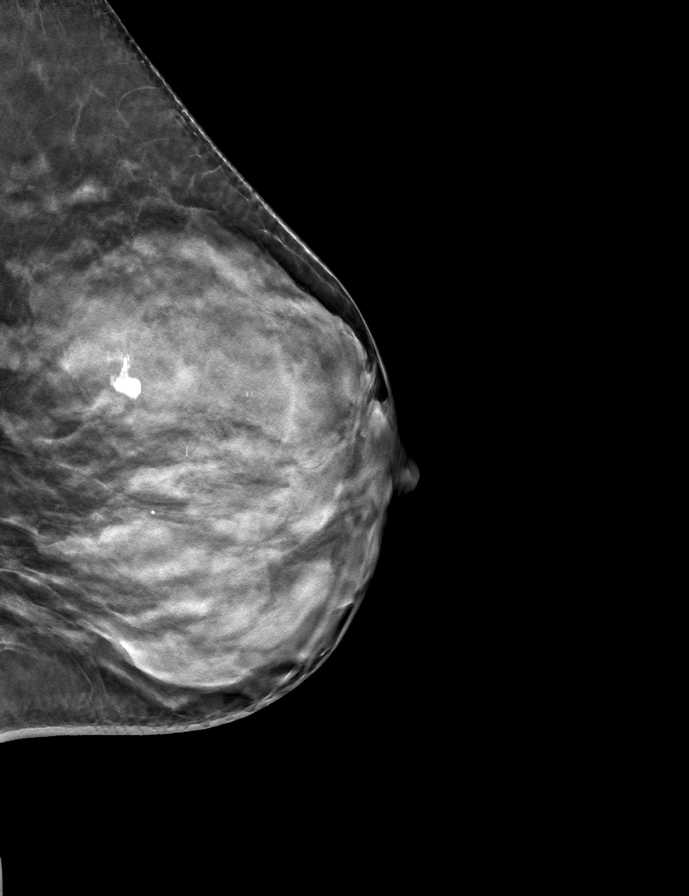

[9 of 40 positions shown; findings below may reference images not displayed]

ACR Breast Density Category d: The breast tissue is extremely dense,
which lowers the sensitivity of mammography.
FINDINGS: No concerning masses, calcifications or nonsurgical distortion
identified within either breast.
IMPRESSION: No mammographic evidence for malignancy.

RECOMMENDATION:
Screening mammogram in one year.(Code:C2-7-1UQ)

I have discussed the findings and recommendations with the patient.
If applicable, a reminder letter will be sent to the patient
regarding the next appointment.

BI-RADS CATEGORY  1: Negative.
